# Patient Record
Sex: Female | Born: 1945 | Race: White | Hispanic: No | Marital: Married | Smoking: Former smoker
Health system: Southern US, Community
[De-identification: ages and names within clinical notes are randomized; demographics above are authoritative.]

## PROBLEM LIST (undated history)

## (undated) DIAGNOSIS — Z8709 Personal history of other diseases of the respiratory system: Secondary | ICD-10-CM

## (undated) DIAGNOSIS — F419 Anxiety disorder, unspecified: Secondary | ICD-10-CM

## (undated) DIAGNOSIS — E119 Type 2 diabetes mellitus without complications: Secondary | ICD-10-CM

## (undated) DIAGNOSIS — I8390 Asymptomatic varicose veins of unspecified lower extremity: Secondary | ICD-10-CM

## (undated) DIAGNOSIS — M2540 Effusion, unspecified joint: Secondary | ICD-10-CM

## (undated) DIAGNOSIS — K219 Gastro-esophageal reflux disease without esophagitis: Secondary | ICD-10-CM

## (undated) DIAGNOSIS — F329 Major depressive disorder, single episode, unspecified: Secondary | ICD-10-CM

## (undated) DIAGNOSIS — I82409 Acute embolism and thrombosis of unspecified deep veins of unspecified lower extremity: Secondary | ICD-10-CM

## (undated) DIAGNOSIS — J449 Chronic obstructive pulmonary disease, unspecified: Secondary | ICD-10-CM

## (undated) DIAGNOSIS — I739 Peripheral vascular disease, unspecified: Secondary | ICD-10-CM

## (undated) DIAGNOSIS — T148XXA Other injury of unspecified body region, initial encounter: Secondary | ICD-10-CM

## (undated) DIAGNOSIS — R011 Cardiac murmur, unspecified: Secondary | ICD-10-CM

## (undated) DIAGNOSIS — E785 Hyperlipidemia, unspecified: Secondary | ICD-10-CM

## (undated) DIAGNOSIS — D649 Anemia, unspecified: Secondary | ICD-10-CM

## (undated) DIAGNOSIS — R51 Headache: Secondary | ICD-10-CM

## (undated) DIAGNOSIS — R519 Headache, unspecified: Secondary | ICD-10-CM

## (undated) DIAGNOSIS — F32A Depression, unspecified: Secondary | ICD-10-CM

## (undated) DIAGNOSIS — IMO0001 Reserved for inherently not codable concepts without codable children: Secondary | ICD-10-CM

## (undated) DIAGNOSIS — N281 Cyst of kidney, acquired: Secondary | ICD-10-CM

## (undated) DIAGNOSIS — I2699 Other pulmonary embolism without acute cor pulmonale: Secondary | ICD-10-CM

## (undated) DIAGNOSIS — M1990 Unspecified osteoarthritis, unspecified site: Secondary | ICD-10-CM

## (undated) DIAGNOSIS — N393 Stress incontinence (female) (male): Secondary | ICD-10-CM

## (undated) DIAGNOSIS — Z8701 Personal history of pneumonia (recurrent): Secondary | ICD-10-CM

## (undated) HISTORY — PX: ESOPHAGOGASTRODUODENOSCOPY: SHX1529

## (undated) HISTORY — DX: Asymptomatic varicose veins of unspecified lower extremity: I83.90

## (undated) HISTORY — PX: TUBAL LIGATION: SHX77

## (undated) HISTORY — PX: COLONOSCOPY: SHX174

## (undated) HISTORY — DX: Acute embolism and thrombosis of unspecified deep veins of unspecified lower extremity: I82.409

## (undated) HISTORY — DX: Hyperlipidemia, unspecified: E78.5

## (undated) HISTORY — PX: CARPAL TUNNEL RELEASE: SHX101

## (undated) HISTORY — DX: Other pulmonary embolism without acute cor pulmonale: I26.99

## (undated) HISTORY — DX: Type 2 diabetes mellitus without complications: E11.9

## (undated) SURGERY — VIDEO BRONCHOSCOPY WITHOUT FLUORO
Anesthesia: General

---

## 1971-02-06 HISTORY — PX: APPENDECTOMY: SHX54

## 1992-02-06 HISTORY — PX: ABDOMINAL HYSTERECTOMY: SHX81

## 1992-02-06 HISTORY — PX: VESICOVAGINAL FISTULA CLOSURE W/ TAH: SUR271

## 1992-02-06 HISTORY — PX: OTHER SURGICAL HISTORY: SHX169

## 2006-12-27 ENCOUNTER — Ambulatory Visit: Payer: Self-pay | Admitting: Vascular Surgery

## 2010-06-20 NOTE — Consult Note (Signed)
NEW PATIENT CONSULTATION   Mason, Yvonne  DOB:  1945/12/20                                       12/27/2006  FAOZH#:[id number]   Patient presents today for continued discussion of her lower extremity  arterial insufficiency.  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  I  subsequently saw her again in February, 2002 with similar difficulty, at  which time she was found to have an ankle-arm index of 0.7 bilaterally,  associated with iliac occlusive disease.   She is here today for further discussion.  She had recently undergone  lower extremity arterial studies and also CT angiogram of her abdomen  and lower extremities.  This, again, revealed iliac occlusive disease,  and CT apparently shows bilateral common iliac artery occlusions.  She  has been stable from an overall medical standpoint.  She does have non-  insulin-dependent diabetes and elevated cholesterol.  She does have a  history of phlebitis and pulmonary embolus in the remote past.  She does  not have any cardiac history aside from a heart murmur, which is stable.  She does have premature atherosclerotic disease in her mother and  sister.   SOCIAL HISTORY:  She is married with five children.  She is retired.  She does smoke one pack of cigarettes per day and does not drink alcohol  on a regular basis.   Review of systems is multiply positive for shortness of breath with  exertion, heart murmur, bronchitis, peptic ulcer disease, diarrhea, and  constipation, urinary frequency and burning with urination.  She does  have headaches, arthritic joint pain, depression, and nervousness.   MEDICATION ALLERGIES:  CIPRO, ASPIRIN.   MEDICATIONS:  Propranolol, Crestor, Prevacid, glipizide, and Actos.   PHYSICAL EXAMINATION:  A well-developed and well-nourished white female  appearing her stated age of 49.  Blood pressure is 115/70, pulse 80,  respirations 18.  Her O2 saturations are 99% on room air.  Her carotid  arteries are without bruits bilaterally.  She is neurologically intact.  Her radial pulses are 2+.  She has 1+ femoral pulses bilaterally.  She  does have a 1+ right posterior tibial pulse, and I did not palpate pedal  pulses on the left.   I reviewed her studies with the patient.  I explained that she does  continue to have very stable lower extremity arterial insufficiency  related to her iliac occlusive disease.  I discussed options with her.  She reports that she has mild limitation only by the calf and leg  claudication and is comfortable to continue to observe this only.  She  does have a separate component of pain in her lower mid back that  extends down into her right leg, which she says occurs intermittently  and can be at rest or exercise.  I think that this would be very  unlikely to be related to arterial insufficiency.  She has had somewhat  of a workup for degenerative disk disease, and apparently this has been  negative.  I explained that I did not feel that any treatment for her  iliac occlusions would give any benefit on this component of her  discomfort.  She is pleased with this discussion and will see [place] on an  as-needed basis.   Larina Earthly,  M.D.  Electronically Signed   TFE/MEDQ  D:  12/27/2006  T:  12/30/2006  Job:  [id number]   cc:   Dr. Abner Greenspan

## 2014-08-16 ENCOUNTER — Ambulatory Visit (INDEPENDENT_AMBULATORY_CARE_PROVIDER_SITE_OTHER): Payer: Medicare Other | Admitting: Internal Medicine

## 2014-08-16 ENCOUNTER — Encounter: Payer: Self-pay | Admitting: Internal Medicine

## 2014-08-16 ENCOUNTER — Encounter (INDEPENDENT_AMBULATORY_CARE_PROVIDER_SITE_OTHER): Payer: Self-pay

## 2014-08-16 VITALS — BP 124/80 | HR 58 | Ht 63.0 in | Wt 137.8 lb

## 2014-08-16 DIAGNOSIS — R05 Cough: Secondary | ICD-10-CM | POA: Insufficient documentation

## 2014-08-16 DIAGNOSIS — F17210 Nicotine dependence, cigarettes, uncomplicated: Secondary | ICD-10-CM

## 2014-08-16 DIAGNOSIS — R590 Localized enlarged lymph nodes: Secondary | ICD-10-CM | POA: Insufficient documentation

## 2014-08-16 DIAGNOSIS — R0600 Dyspnea, unspecified: Secondary | ICD-10-CM | POA: Diagnosis not present

## 2014-08-16 DIAGNOSIS — I10 Essential (primary) hypertension: Secondary | ICD-10-CM

## 2014-08-16 DIAGNOSIS — R599 Enlarged lymph nodes, unspecified: Secondary | ICD-10-CM

## 2014-08-16 DIAGNOSIS — R059 Cough, unspecified: Secondary | ICD-10-CM

## 2014-08-16 MED ORDER — METHYLPREDNISOLONE ACETATE 80 MG/ML IJ SUSP
120.0000 mg | Freq: Once | INTRAMUSCULAR | Status: AC
  Administered 2014-08-16: 120 mg via INTRAMUSCULAR

## 2014-08-16 MED ORDER — FAMOTIDINE 20 MG PO TABS: ORAL_TABLET | ORAL | Status: AC

## 2014-08-16 MED ORDER — TIOTROPIUM BROMIDE-OLODATEROL 2.5-2.5 MCG/ACT IN AERS: 2.0000 | INHALATION_SPRAY | Freq: Every day | RESPIRATORY_TRACT | Status: AC

## 2014-08-16 MED ORDER — METOPROLOL TARTRATE 50 MG PO TABS: 50.0000 mg | ORAL_TABLET | Freq: Two times a day (BID) | ORAL | Status: AC

## 2014-08-16 MED ORDER — ALPRAZOLAM 0.5 MG PO TABS: ORAL_TABLET | ORAL | Status: DC

## 2014-08-16 MED ORDER — PANTOPRAZOLE SODIUM 40 MG PO TBEC: 40.0000 mg | DELAYED_RELEASE_TABLET | Freq: Every day | ORAL | Status: DC

## 2014-08-16 MED ORDER — TRAMADOL HCL 50 MG PO TABS: ORAL_TABLET | ORAL | Status: AC

## 2014-08-16 NOTE — Progress Notes (Signed)
   Subjective:    Patient ID: Yvonne Mason, female    DOB: April 30, 1945,  MRN: [id number]  HPI  1 yowf active smoker with cough x around 2012 much worse since 05/2014 > cxr  New R hilar density > CT c/w mediastinal nodes so referred to pulmonary clinic 08/16/14 by Dr Owens Shark in [facility]     08/16/2014 1st [facility] office visit/ [facility]   Chief Complaint  Patient presents with  . Pulmonary Consult    Referred by Dr. Rosario Adie for eval abnormal ct chest. Pt states she has had cough for years but it has been worse for the past 2 months. Cough is prod with yellow sputum- mainly in the am's. She states she gets SOB walking approx 30 ft.   on high dose inderal chronically for migraines and spiriva dpi which seems to make her cough more / no hemoptysis/ not much better on saba  No obvious day to day or daytime variability or assoc  cp or chest tightness, subjective wheeze or overt sinus   symptoms. No unusual exp hx or h/o childhood pna/ asthma or knowledge of premature birth.  Sleeping ok without nocturnal  or early am exacerbation  of respiratory  c/o's or need for noct saba. Also denies any obvious fluctuation of symptoms with weather or environmental changes or other aggravating or alleviating factors except as outlined above   Current Medications, Allergies, Complete Past Medical History, Past Surgical History, Family History, and Social History were reviewed in [employer] record.          Review of Systems  Constitutional: Negative for fever, chills and unexpected weight change.  HENT: Positive for congestion, dental problem, sore throat and trouble swallowing. Negative for ear pain, nosebleeds, postnasal drip, rhinorrhea, sinus pressure, sneezing and voice change.   Eyes: Negative for visual disturbance.  Respiratory: Positive for cough and shortness of breath. Negative for choking.   Cardiovascular: Positive for leg swelling. Negative for chest pain.    Gastrointestinal: Positive for abdominal pain. Negative for vomiting and diarrhea.  Genitourinary: Negative for difficulty urinating.       Acid heartburn Indigestion  Musculoskeletal: Positive for arthralgias.  Skin: Negative for rash.  Neurological: Negative for tremors, syncope and headaches.  Hematological: Does not bruise/bleed easily.       Objective:   Physical Exam  Amb wf nad  Wt Readings from Last 3 Encounters:  08/16/14 137 lb 12.8 oz (62.506 kg)    Vital signs reviewed   HEENT: top denture/ partial lower/ turbinates, and orophanx. Nl external ear canals without cough reflex   NECK :  without JVD/Nodes/TM/ nl carotid upstrokes bilaterally   LUNGS: no acc muscle use, clear to A and P bilaterally without cough on insp or exp maneuvers   CV:  RRR  no s3 or murmur or increase in P2, no edema   ABD:  soft and nontender with nl excursion in the supine position. 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

## 2014-08-16 NOTE — Patient Instructions (Signed)
The key to effective treatment for your cough is eliminating the non-stop cycle of cough you're stuck in long enough to let your airway heal completely and then see if there is anything still making you cough once you stop the cough suppression, but this should take no more than 5 days to figure out  First take delsym two tsp every 12 hours and supplement if needed with  tramadol 50 mg up to 2 every 4 hours to suppress the urge to cough at all or even clear your throat. Swallowing water or using ice chips/non mint and menthol containing candies (such as lifesavers or sugarless jolly ranchers) are also effective.  You should rest your voice and avoid activities that you know make you cough.  Once you have eliminated the cough for 3 straight days try reducing the tramadol first,  then the delsym as tolerated.    Depomedrol  120 mg   Protonix (pantoprazole) Take 30-60 min before first meal of the day and Pepcid 20 mg one bedtime plus chlorpheniramine 4 mg x 2 at bedtime (both available over the counter)  until cough is completely gone for at least a week without the need for cough suppression  GERD (REFLUX)  is an extremely common cause of respiratory symptoms, many times with no significant heartburn at all.    It can be treated with medication, but also with lifestyle changes including avoidance of late meals, excessive alcohol, smoking cessation, and avoid fatty foods, chocolate, peppermint, colas, red wine, and acidic juices such as orange juice.  NO MINT OR MENTHOL PRODUCTS SO NO COUGH DROPS  USE HARD CANDY INSTEAD (jolley ranchers or Stover's or Lifesavers (all available in sugarless versions) NO OIL BASED VITAMINS - use powdered substitutes.  Stop stiolto  and start stiolto 2 each am  Stop inderal and start metaprolol 50 mg twice daily   Please see patient coordinator before you leave today  to schedule PET scan and I will call you to let you know what the next step is

## 2014-08-17 ENCOUNTER — Telehealth: Payer: Self-pay | Admitting: Internal Medicine

## 2014-08-17 DIAGNOSIS — I10 Essential (primary) hypertension: Secondary | ICD-10-CM | POA: Insufficient documentation

## 2014-08-17 DIAGNOSIS — R059 Cough, unspecified: Secondary | ICD-10-CM

## 2014-08-17 DIAGNOSIS — R05 Cough: Secondary | ICD-10-CM

## 2014-08-17 DIAGNOSIS — F17210 Nicotine dependence, cigarettes, uncomplicated: Secondary | ICD-10-CM | POA: Insufficient documentation

## 2014-08-17 NOTE — Assessment & Plan Note (Addendum)
-  See CT chest 08/10/14 corresponding to new R hilar density on plain cxr vs 08/06/13  So this is definitely not an incidentaloma with ddx including lung ca and lymphoma vs other met dz   Discussed in detail all the  indications, usual  risks and alternatives  relative to the benefits with patient who agrees to proceed with PET first then decide on bes approach to bx but first need to work on breathing/coughing

## 2014-08-17 NOTE — Assessment & Plan Note (Signed)
>   3 min discussion I reviewed the Fletcher curve with the patient that basically indicates  if you quit smoking when your best day FEV1 is still well preserved (as is probably the case here)  it is highly unlikely you will progress to severe disease and informed the patient there was no medication on the market that has proven to alter the curve/ its downward trajectory  or the likelihood of progression of their disease.  Therefore stopping smoking and maintaining abstinence is the most important aspect of care, not choice of inhalers or for that matter, doctors.

## 2014-08-17 NOTE — Assessment & Plan Note (Signed)
Chronic / severe/ difficult to control. DDX of  difficult airways management all start with A and  include Adherence, Ace Inhibitors, Acid Reflux, Active Sinus Disease, Alpha 1 Antitripsin deficiency, Anxiety masquerading as Airways dz,  ABPA,  allergy(esp in young), Aspiration (esp in elderly), Adverse effects of meds,  Active smokers, A bunch of PE's (a small clot burden can't cause this syndrome unless there is already severe underlying pulm or vascular dz with poor reserve) plus two Bs  = Bronchiectasis and Beta blocker use..and one C= CHF  Adherence is always the initial "prime suspect" and is a multilayered concern that requires a "trust but verify" approach in every patient - starting with knowing how to use medications, especially inhalers, correctly, keeping up with refills and understanding the fundamental difference between maintenance and prns vs those medications only taken for a very short course and then stopped and not refilled.  - The proper method of use, as well as anticipated side effects, of a metered-dose inhaler are discussed and demonstrated to the patient. Improved effectiveness after extensive coaching during this visit to a level of approximately  75% > try respimat stiolto 2 q am  Active smoking > see sep a/p  ? Acid (or non-acid) GERD > always difficult to exclude as up to 75% of pts in some series report no assoc GI/ Heartburn symptoms> rec max (24h)  acid suppression and diet restrictions/ reviewed and instructions given in writing.   ? Adverse effects of dpi > d/c spiriva dpi and replace with stiolto respimat    I had an extended discussion with the patient reviewing all relevant studies completed to date x 24 m   Each maintenance medication was reviewed in detail including most importantly the difference between maintenance and prns and under what circumstances the prns are to be triggered using an action plan format that is not reflected in the computer generated  alphabetically organized AVS.    Please see instructions for details which were reviewed in writing and the patient given a copy highlighting the part that I personally wrote and discussed at today's ov.

## 2014-08-17 NOTE — Telephone Encounter (Signed)
lmtcb x1 

## 2014-08-17 NOTE — Assessment & Plan Note (Signed)
-   spirometry 08/16/14 non physiologic - 08/17/2014  Walked RA  2 laps @ 185 ft each stopped due to  Sob and leg pain, nl pace no desat   Not really clear how much copd she has at this point / will focus on eliminating cough.

## 2014-08-17 NOTE — Assessment & Plan Note (Signed)
Strongly prefer in this setting: Bystolic, the most beta -1  selective Beta blocker available in sample form, with bisoprolol the most selective generic choice  on the market.     However, since she's using inderal also for migraines and really doesn't appear to have much of an asthmatic component rec lopressor 50 mg bid trial as it is intermediate between inderal and bisoprolol

## 2014-08-18 MED ORDER — PANTOPRAZOLE SODIUM 40 MG PO TBEC: 40.0000 mg | DELAYED_RELEASE_TABLET | Freq: Every day | ORAL | Status: AC

## 2014-08-18 NOTE — Telephone Encounter (Signed)
Daughter informed new rx sent to pharmacy. Nothing further needed.

## 2014-08-24 ENCOUNTER — Encounter (HOSPITAL_COMMUNITY)
Admission: RE | Admit: 2014-08-24 | Discharge: 2014-08-24 | Disposition: A | Discharge status: Home / Self Care | Payer: Medicare Other | Source: Ambulatory Visit | Attending: Internal Medicine | Admitting: Internal Medicine

## 2014-08-24 DIAGNOSIS — R590 Localized enlarged lymph nodes: Secondary | ICD-10-CM

## 2014-08-24 DIAGNOSIS — R599 Enlarged lymph nodes, unspecified: Secondary | ICD-10-CM | POA: Diagnosis not present

## 2014-08-24 LAB — GLUCOSE, CAPILLARY: Glucose-Capillary: 130 mg/dL — ABNORMAL HIGH (ref 65–99)

## 2014-08-24 MED ORDER — FLUDEOXYGLUCOSE F - 18 (FDG) INJECTION
6.9000 | Freq: Once | INTRAVENOUS | Status: AC | PRN
  Administered 2014-08-24: 6.9 via INTRAVENOUS

## 2014-08-30 ENCOUNTER — Telehealth: Payer: Self-pay | Admitting: Internal Medicine

## 2014-08-30 DIAGNOSIS — R590 Localized enlarged lymph nodes: Secondary | ICD-10-CM

## 2014-08-30 NOTE — Telephone Encounter (Signed)
Dr Lake Bells reviewed the case and agreed to do EBUS and turned it over to [facility] to schedule but she was gone 7/22 and today so that's probably why she hasn't heard from them

## 2014-08-30 NOTE — Telephone Encounter (Signed)
Patient notified that she will get a call from [facility] once she has set up the EBUS.  Patient wanted to know if she has to do it at [facility] or can she have it done at [facility]?  She would prefer to have it done at [facility].  Patient is aware that Caryl Pina is not in the office today and will contact her once she gets back.  (Per Dr. Melvyn Novas, Caryl Pina and [facility] are working on this, I do not see any notes in the chart regarding EBUS)  To Caryl Pina for follow up

## 2014-08-30 NOTE — Telephone Encounter (Signed)
I called spoke with pt. She reports MW called her last week and reports he was going to refer her to [facility]. Nothing in epic. Please advise thanks

## 2014-08-31 NOTE — Telephone Encounter (Signed)
Order placed.  Please let me know if you need anything further.  Thanks!

## 2014-08-31 NOTE — Telephone Encounter (Signed)
Libby generally schedules our EBUS's.  I do not see anything scheduled for this.    Libby, please advise if you have anything on this.  Thanks!

## 2014-08-31 NOTE — Telephone Encounter (Signed)
Nobody out in an order in your absence so just put me in an order thanks Joellen Jersey

## 2014-09-01 ENCOUNTER — Telehealth: Payer: Self-pay | Admitting: Internal Medicine

## 2014-09-02 NOTE — Telephone Encounter (Signed)
Spoke with patient states that she was notified of her upcoming procedure EBUS'@[facility]'$  09/06/14'@7'$ :30am  Patient states that she was supposed to get a call back with further information but has not heard from [facility].  Will send to [facility] to contact patient.

## 2014-09-02 NOTE — Telephone Encounter (Signed)
Pt notified someon from [facility] will call her with pre-op ins before her appt on 09/06/14  Joellen Jersey

## 2014-09-06 ENCOUNTER — Ambulatory Visit (HOSPITAL_COMMUNITY): Payer: Medicare Other

## 2014-09-06 ENCOUNTER — Ambulatory Visit (HOSPITAL_COMMUNITY): Admission: RE | Admit: 2014-09-06 | Payer: Medicare Other | Source: Ambulatory Visit | Admitting: Pulmonary Disease

## 2014-09-06 ENCOUNTER — Encounter (HOSPITAL_COMMUNITY): Admission: RE | Payer: Self-pay | Source: Ambulatory Visit

## 2014-09-06 SURGERY — ENDOBRONCHIAL ULTRASOUND (EBUS)
Anesthesia: General | Laterality: Bilateral

## 2014-09-07 ENCOUNTER — Telehealth: Payer: Self-pay | Admitting: Pulmonary Disease

## 2014-09-07 ENCOUNTER — Other Ambulatory Visit: Payer: Self-pay | Admitting: Pulmonary Disease

## 2014-09-07 DIAGNOSIS — R590 Localized enlarged lymph nodes: Secondary | ICD-10-CM

## 2014-09-07 NOTE — Progress Notes (Signed)
Spoke with Yvonne Mason at Dr. Anastasia Pall office and requested that orders be placed prior to patient's PAT appointment.

## 2014-09-07 NOTE — Telephone Encounter (Signed)
Called spoke w/ Ria Comment from [facility] short stay. She reports pt is going to have bronch done and they are wanting orders prior to pt pre admit tomorrow AM at 10.  Please advise thanks

## 2014-09-07 NOTE — Telephone Encounter (Signed)
Hi, I tried to put in what orders I can.  However we typically use an orderset that is accessible once the patient has the appointment scheduled.  If they can create the surgery encounter I can use the orderset to put in all the usual orders.  You might ask [facility] for more information

## 2014-09-08 ENCOUNTER — Encounter (HOSPITAL_COMMUNITY): Payer: Self-pay

## 2014-09-08 ENCOUNTER — Encounter (HOSPITAL_COMMUNITY)
Admission: RE | Admit: 2014-09-08 | Discharge: 2014-09-08 | Disposition: A | Discharge status: Home / Self Care | Payer: Medicare Other | Source: Ambulatory Visit | Attending: Pulmonary Disease | Admitting: Pulmonary Disease

## 2014-09-08 ENCOUNTER — Other Ambulatory Visit: Payer: Self-pay

## 2014-09-08 DIAGNOSIS — J449 Chronic obstructive pulmonary disease, unspecified: Secondary | ICD-10-CM | POA: Diagnosis not present

## 2014-09-08 DIAGNOSIS — R590 Localized enlarged lymph nodes: Secondary | ICD-10-CM | POA: Diagnosis present

## 2014-09-08 DIAGNOSIS — E785 Hyperlipidemia, unspecified: Secondary | ICD-10-CM | POA: Diagnosis not present

## 2014-09-08 DIAGNOSIS — E119 Type 2 diabetes mellitus without complications: Secondary | ICD-10-CM | POA: Diagnosis not present

## 2014-09-08 DIAGNOSIS — F17210 Nicotine dependence, cigarettes, uncomplicated: Secondary | ICD-10-CM | POA: Diagnosis not present

## 2014-09-08 DIAGNOSIS — C383 Malignant neoplasm of mediastinum, part unspecified: Secondary | ICD-10-CM | POA: Diagnosis not present

## 2014-09-08 HISTORY — DX: Anxiety disorder, unspecified: F41.9

## 2014-09-08 HISTORY — DX: Unspecified osteoarthritis, unspecified site: M19.90

## 2014-09-08 HISTORY — DX: Personal history of pneumonia (recurrent): Z87.01

## 2014-09-08 HISTORY — DX: Major depressive disorder, single episode, unspecified: F32.9

## 2014-09-08 HISTORY — DX: Headache: R51

## 2014-09-08 HISTORY — DX: Cardiac murmur, unspecified: R01.1

## 2014-09-08 HISTORY — DX: Cyst of kidney, acquired: N28.1

## 2014-09-08 HISTORY — DX: Stress incontinence (female) (male): N39.3

## 2014-09-08 HISTORY — DX: Anemia, unspecified: D64.9

## 2014-09-08 HISTORY — DX: Effusion, unspecified joint: M25.40

## 2014-09-08 HISTORY — DX: Peripheral vascular disease, unspecified: I73.9

## 2014-09-08 HISTORY — DX: Depression, unspecified: F32.A

## 2014-09-08 HISTORY — DX: Chronic obstructive pulmonary disease, unspecified: J44.9

## 2014-09-08 HISTORY — DX: Other injury of unspecified body region, initial encounter: T14.8XXA

## 2014-09-08 HISTORY — DX: Gastro-esophageal reflux disease without esophagitis: K21.9

## 2014-09-08 HISTORY — DX: Personal history of other diseases of the respiratory system: Z87.09

## 2014-09-08 HISTORY — DX: Reserved for inherently not codable concepts without codable children: IMO0001

## 2014-09-08 HISTORY — DX: Headache, unspecified: R51.9

## 2014-09-08 LAB — PROTIME-INR
INR: 0.99 (ref 0.00–1.49)
Prothrombin Time: 13.3 seconds (ref 11.6–15.2)

## 2014-09-08 LAB — GLUCOSE, CAPILLARY: Glucose-Capillary: 156 mg/dL — ABNORMAL HIGH (ref 65–99)

## 2014-09-08 NOTE — Telephone Encounter (Signed)
Spoke with Yvonne Mason at [facility] short stay in regards to BQ response about the orders. Informed her what BQ said and she states that she would try to find the orders and if she has any further questions she would give [place] a call back. Nothing further needed.

## 2014-09-08 NOTE — Progress Notes (Signed)
PCP- Dr. Laqueta Due at [facility]  Cardiologist - pt. Denies having a cardiologist EKG- 09/08/14 - Epic Echo- denies CXR- requested from [facility] Stress Test- requested from [facility] Cardiac Cath - denies  Pt. States that she has had multiple blood clots and takes Coumadin.  Pt. Stopped her Coumadin on Sunday, August 31st per PCP.  Patient states she will call Dr. Anastasia Pall office today to see if she needs to take a different blood thinner due to clots.

## 2014-09-08 NOTE — Pre-Procedure Instructions (Signed)
    Lexia Vandevender  09/08/2014      [id number] [place] [facility] [id number]: [phone]: [phone]   Your procedure is scheduled on Friday, August 5th, 2016.  [facility]  Call this number if you have problems the morning of surgery:  [phone]   Remember:  Do not eat food or drink liquids after midnight.   Take these medicines the morning of surgery with A SIP OF WATER: Acetaminophen (Tylenol) if needed, Albuterol inhaler (please bring with you), Bupropion (Wellbutrin), Famotidine (Pepcid), fluticasone (Flonase),  Metoprolol (Lopressor), Pantoprazole (Protonix), tiotropium Bromide-Olodaterol, Tramadol (Ultram) if needed.    Stop taking: Warfarin (Coumadin), NSAIDS, Aspirin, Aleve, Naproxen, Ibuprofen, BC's, Goody's, Fish Oil, all herbal medications, and all vitamins.    Do not wear jewelry, make-up or nail polish.  Do not wear lotions, powders, or perfumes.  You may wear deodorant.  Do not shave 48 hours prior to surgery.    Do not bring valuables to the hospital.  [facility] any belongings or [facility]ables.  Contacts, dentures or bridgework may not be worn into surgery.  Leave your suitcase in the car.  After surgery it may be brought to your room.  For patients admitted to the hospital, discharge time will be determined by your treatment team.  Patients discharged the day of surgery will not be allowed to drive home.   Special instructions:  See attached.   Please read over the following fact sheets that you were given. Pain Booklet, Coughing and Deep Breathing and Surgical Site Infection Prevention

## 2014-09-09 ENCOUNTER — Telehealth: Payer: Self-pay | Admitting: Pulmonary Disease

## 2014-09-09 ENCOUNTER — Encounter (HOSPITAL_COMMUNITY): Payer: Self-pay

## 2014-09-09 LAB — HEMOGLOBIN A1C
HEMOGLOBIN A1C: 6 % — AB (ref 4.8–5.6)
Mean Plasma Glucose: 126 mg/dL

## 2014-09-09 NOTE — Telephone Encounter (Signed)
Will restart warfarin after the procedure

## 2014-09-09 NOTE — Progress Notes (Signed)
Anesthesia Chart Review: Patient is a 69 year old female scheduled for EBUS on 09/10/14 by Dr. Lake Bells.  History includes mediastinal adenopathy, smoking, HLD, DVT X 3 previously LLE, last "right foot" 2015), post-operative PE '94, DM2, murmur (not specified), PVD/varicose vein LLE, COPD, SOB, anxiety, depression, renal cyst (right), GERD, migraines, anemia, hysterectomy '94, appendectomy. PCP is Dr. Laqueta Due in [place]. She is not routinely followed by cardiology, but reports a prior stress test at [facility] in [place] > 3 years ago which has been managed by three different groups over the past 10 or so years (most recently [facility] since 2016 and prior to that [facility]). We were referred to [facility] to request records, but were told none found.      Meds include albuterol, Wellbutrin XL, Pepcid, Flonase, glipizide, metoprolol, Protonix, Actos, Stiolto Respimato, tramadol, warfarin--on hold for 5 days before procedure as she has been told in the past.    09/08/14 EKG: NSR, R BBB. Currently, no comparison EKG is available. She denied chest pain and SOB at rest. Activity is more limited by leg pain (arthritis) and to an extent exertional dyspnea which she says has been stable for at least two years (ie DOE when walking up an incline). She was able to walk from the hospital entrance to PAT without stopping or significant SOB. She has chronic LLE edema related to varicose veins. Denied known MI/CAD, CHF history. She reports some doctors have heard a slight murmur, others haven't. Reportedly, she has never had any doctor recommend that she undergo an echocardiogram to further evaluation.    09/04/14 CXR ([facility]): There is a new increased density in the right suprahilar region measuring 2.3 X 3.5 cm. No infiltrate edema mass or effusion otherwise. Heart and bony structures are unremarkable. Impression: Right suprahilar density. Adenopathy versus mass. Recommend chest CT with IV  contrast for further evaluation.  08/13/14 Chest CT W/ [facility]): Two adjacent soft tissue masses within the mediastinum favored to represent bulky lymphadenopathy, potentially in the setting of metastatic disease or lymphoma. Non-specific patchy sclerosis within the lateral aspect of the right ninth rib, this may be postraumatic in etiology or potentially represent a malignant process. Persistent indeterminate and incompletely  characterized low-attenuation lesions within the superior pole of the right kidney. Recommend dedicated evaluation with pre and post contrast-enhanced MRI or CT for definitive characterization.    For unclear reasons only PT/INR and A1C results noted from PAT. She will need a CBC and BMET on arrival. I'll attempt to get comparison labs from her PCP. She reports that she has had protein in her urine before and that was recently told she was mildly anemic by Dr. Laqueta Due.   She denied chest pain, new SOB. Last DVT was ~ one year ago. She will need additional labs tomorrow. If no new symptoms, labs are acceptable, and no worrisome murmur heard on exam then I would anticipate that she can proceed with this procedure. Further evaluation by her assigned anesthesiologist on arrival tomorrow.  George [facility]Anesthesiology Phone [phone] 09/09/2014 11:31 AM

## 2014-09-09 NOTE — Telephone Encounter (Signed)
Called and made pt aware of below. She verbalized understanding and had no questions

## 2014-09-09 NOTE — Progress Notes (Signed)
[facility] to follow-up on requested records.  Was informed that records would need to be requested from [facility] instead of [facility].  Spoke with Ebony Hail at [facility], medical records who stated that they did not have any records for patient.

## 2014-09-09 NOTE — Telephone Encounter (Signed)
[phone] calling back

## 2014-09-09 NOTE — Telephone Encounter (Signed)
Spoke with pt, had her pre-op yesterday for her bronch scheduled with BQ tomorrow.  Pt has been off coumadin since Sunday, and nurse working the pre-op was concerned that she's been off since Sunday.  Nurse at pre-op had suggested that pt let BQ know about her coumadin dose, and see if he recommends an ASA for pt.  I confirmed with pt that ASA is on her allergy list.  Pt states that ASA makes her nauseous.    BQ please advise on any recs.  Thanks!

## 2014-09-09 NOTE — Telephone Encounter (Signed)
lmtcb for pt.  

## 2014-09-10 ENCOUNTER — Ambulatory Visit (HOSPITAL_COMMUNITY): Payer: Medicare Other | Admitting: Vascular Surgery

## 2014-09-10 ENCOUNTER — Telehealth: Payer: Self-pay | Admitting: Pulmonary Disease

## 2014-09-10 ENCOUNTER — Ambulatory Visit (HOSPITAL_COMMUNITY): Payer: Medicare Other | Admitting: Certified Registered Nurse Anesthetist

## 2014-09-10 ENCOUNTER — Encounter (HOSPITAL_COMMUNITY)
Admission: RE | Disposition: A | Discharge status: Home / Self Care | Payer: Self-pay | Source: Ambulatory Visit | Attending: Pulmonary Disease

## 2014-09-10 ENCOUNTER — Encounter (HOSPITAL_COMMUNITY): Payer: Self-pay | Admitting: *Deleted

## 2014-09-10 ENCOUNTER — Other Ambulatory Visit: Payer: Self-pay | Admitting: Pulmonary Disease

## 2014-09-10 ENCOUNTER — Ambulatory Visit (HOSPITAL_COMMUNITY)
Admission: RE | Admit: 2014-09-10 | Discharge: 2014-09-10 | Disposition: A | Discharge status: Home / Self Care | Payer: Medicare Other | Source: Ambulatory Visit | Attending: Pulmonary Disease | Admitting: Pulmonary Disease

## 2014-09-10 DIAGNOSIS — E119 Type 2 diabetes mellitus without complications: Secondary | ICD-10-CM | POA: Diagnosis not present

## 2014-09-10 DIAGNOSIS — R590 Localized enlarged lymph nodes: Secondary | ICD-10-CM

## 2014-09-10 DIAGNOSIS — J449 Chronic obstructive pulmonary disease, unspecified: Secondary | ICD-10-CM | POA: Insufficient documentation

## 2014-09-10 DIAGNOSIS — R599 Enlarged lymph nodes, unspecified: Secondary | ICD-10-CM

## 2014-09-10 DIAGNOSIS — C3401 Malignant neoplasm of right main bronchus: Secondary | ICD-10-CM

## 2014-09-10 DIAGNOSIS — F17210 Nicotine dependence, cigarettes, uncomplicated: Secondary | ICD-10-CM | POA: Insufficient documentation

## 2014-09-10 DIAGNOSIS — E785 Hyperlipidemia, unspecified: Secondary | ICD-10-CM | POA: Diagnosis not present

## 2014-09-10 DIAGNOSIS — C383 Malignant neoplasm of mediastinum, part unspecified: Secondary | ICD-10-CM | POA: Diagnosis not present

## 2014-09-10 DIAGNOSIS — Z720 Tobacco use: Secondary | ICD-10-CM

## 2014-09-10 HISTORY — PX: VIDEO BRONCHOSCOPY WITH ENDOBRONCHIAL ULTRASOUND: SHX6177

## 2014-09-10 LAB — BASIC METABOLIC PANEL
ANION GAP: 7 (ref 5–15)
BUN: 14 mg/dL (ref 6–20)
CALCIUM: 9.9 mg/dL (ref 8.9–10.3)
CHLORIDE: 107 mmol/L (ref 101–111)
CO2: 26 mmol/L (ref 22–32)
Creatinine, Ser: 0.83 mg/dL (ref 0.44–1.00)
GFR calc Af Amer: >60 mL/min (ref >60)
GFR calc non Af Amer: >60 mL/min (ref >60)
Glucose, Bld: 127 mg/dL — ABNORMAL HIGH (ref 65–99)
POTASSIUM: 4 mmol/L (ref 3.5–5.1)
Sodium: 140 mmol/L (ref 135–145)

## 2014-09-10 LAB — CBC
HEMATOCRIT: 34.2 % — AB (ref 36.0–46.0)
HEMOGLOBIN: 11.2 g/dL — AB (ref 12.0–15.0)
MCH: 29.5 pg (ref 26.0–34.0)
MCHC: 32.7 g/dL (ref 30.0–36.0)
MCV: 90 fL (ref 78.0–100.0)
PLATELETS: 283 10*3/uL (ref 150–400)
RBC: 3.8 MIL/uL — ABNORMAL LOW (ref 3.87–5.11)
RDW: 15 % (ref 11.5–15.5)
WBC: 6.8 10*3/uL (ref 4.0–10.5)

## 2014-09-10 LAB — GLUCOSE, CAPILLARY
GLUCOSE-CAPILLARY: 128 mg/dL — AB (ref 65–99)
Glucose-Capillary: 93 mg/dL (ref 65–99)

## 2014-09-10 SURGERY — BRONCHOSCOPY, WITH EBUS
Anesthesia: General | Site: Chest

## 2014-09-10 MED ORDER — GLYCOPYRROLATE 0.2 MG/ML IJ SOLN
INTRAMUSCULAR | Status: DC | PRN
  Administered 2014-09-10: .4 mg via INTRAVENOUS

## 2014-09-10 MED ORDER — ROCURONIUM BROMIDE 50 MG/5ML IV SOLN
INTRAVENOUS | Status: AC
  Filled 2014-09-10: qty 1

## 2014-09-10 MED ORDER — STERILE WATER FOR INJECTION IJ SOLN
INTRAMUSCULAR | Status: AC
  Filled 2014-09-10: qty 20

## 2014-09-10 MED ORDER — LIDOCAINE HCL (CARDIAC) 20 MG/ML IV SOLN
INTRAVENOUS | Status: DC | PRN
  Administered 2014-09-10: 60 mg via INTRAVENOUS

## 2014-09-10 MED ORDER — LACTATED RINGERS IV SOLN
INTRAVENOUS | Status: DC
  Administered 2014-09-10 (×2): via INTRAVENOUS

## 2014-09-10 MED ORDER — 0.9 % SODIUM CHLORIDE (POUR BTL) OPTIME
TOPICAL | Status: DC | PRN
  Administered 2014-09-10: 1000 mL

## 2014-09-10 MED ORDER — MIDAZOLAM HCL 5 MG/5ML IJ SOLN
INTRAMUSCULAR | Status: DC | PRN
  Administered 2014-09-10: 2 mg via INTRAVENOUS

## 2014-09-10 MED ORDER — LACTATED RINGERS IV SOLN: INTRAVENOUS | Status: DC

## 2014-09-10 MED ORDER — NEOSTIGMINE METHYLSULFATE 10 MG/10ML IV SOLN
INTRAVENOUS | Status: DC | PRN
  Administered 2014-09-10: 3 mg via INTRAVENOUS

## 2014-09-10 MED ORDER — VECURONIUM BROMIDE 10 MG IV SOLR
INTRAVENOUS | Status: AC
  Filled 2014-09-10: qty 20

## 2014-09-10 MED ORDER — EPHEDRINE SULFATE 50 MG/ML IJ SOLN
INTRAMUSCULAR | Status: DC | PRN
  Administered 2014-09-10: 5 mg via INTRAVENOUS

## 2014-09-10 MED ORDER — EPHEDRINE SULFATE 50 MG/ML IJ SOLN
INTRAMUSCULAR | Status: AC
  Filled 2014-09-10: qty 3

## 2014-09-10 MED ORDER — ONDANSETRON HCL 4 MG/2ML IJ SOLN
INTRAMUSCULAR | Status: DC | PRN
  Administered 2014-09-10: 4 mg via INTRAVENOUS

## 2014-09-10 MED ORDER — BUTAMBEN-TETRACAINE-BENZOCAINE 2-2-14 % EX AERO: 1.0000 | INHALATION_SPRAY | Freq: Once | CUTANEOUS | Status: DC

## 2014-09-10 MED ORDER — ROCURONIUM BROMIDE 100 MG/10ML IV SOLN
INTRAVENOUS | Status: DC | PRN
  Administered 2014-09-10: 30 mg via INTRAVENOUS

## 2014-09-10 MED ORDER — FENTANYL CITRATE (PF) 100 MCG/2ML IJ SOLN
INTRAMUSCULAR | Status: DC | PRN
  Administered 2014-09-10 (×3): 50 ug via INTRAVENOUS

## 2014-09-10 MED ORDER — NEOSTIGMINE METHYLSULFATE 10 MG/10ML IV SOLN
INTRAVENOUS | Status: AC
  Filled 2014-09-10: qty 2

## 2014-09-10 MED ORDER — MEPERIDINE HCL 25 MG/ML IJ SOLN: 6.2500 mg | INTRAMUSCULAR | Status: DC | PRN

## 2014-09-10 MED ORDER — SODIUM CHLORIDE 0.9 % IJ SOLN
INTRAMUSCULAR | Status: AC
  Filled 2014-09-10: qty 10

## 2014-09-10 MED ORDER — MIDAZOLAM HCL 2 MG/2ML IJ SOLN
INTRAMUSCULAR | Status: AC
  Filled 2014-09-10: qty 4

## 2014-09-10 MED ORDER — PHENYLEPHRINE 40 MCG/ML (10ML) SYRINGE FOR IV PUSH (FOR BLOOD PRESSURE SUPPORT)
PREFILLED_SYRINGE | INTRAVENOUS | Status: AC
  Filled 2014-09-10: qty 20

## 2014-09-10 MED ORDER — PHENYLEPHRINE HCL 0.25 % NA SOLN
1.0000 | Freq: Four times a day (QID) | NASAL | Status: DC | PRN
  Filled 2014-09-10: qty 15

## 2014-09-10 MED ORDER — ONDANSETRON HCL 4 MG/2ML IJ SOLN
INTRAMUSCULAR | Status: AC
  Filled 2014-09-10: qty 2

## 2014-09-10 MED ORDER — SUCCINYLCHOLINE CHLORIDE 20 MG/ML IJ SOLN
INTRAMUSCULAR | Status: AC
  Filled 2014-09-10: qty 1

## 2014-09-10 MED ORDER — FENTANYL CITRATE (PF) 250 MCG/5ML IJ SOLN
INTRAMUSCULAR | Status: AC
  Filled 2014-09-10: qty 5

## 2014-09-10 MED ORDER — PROPOFOL 10 MG/ML IV BOLUS
INTRAVENOUS | Status: DC | PRN
  Administered 2014-09-10: 120 mg via INTRAVENOUS

## 2014-09-10 MED ORDER — ESMOLOL HCL 10 MG/ML IV SOLN
INTRAVENOUS | Status: AC
  Filled 2014-09-10: qty 10

## 2014-09-10 MED ORDER — LIDOCAINE HCL (CARDIAC) 20 MG/ML IV SOLN
INTRAVENOUS | Status: AC
  Filled 2014-09-10: qty 5

## 2014-09-10 MED ORDER — LIDOCAINE HCL 2 % EX GEL: 1.0000 "application " | Freq: Once | CUTANEOUS | Status: DC

## 2014-09-10 MED ORDER — PROMETHAZINE HCL 25 MG/ML IJ SOLN: 6.2500 mg | INTRAMUSCULAR | Status: DC | PRN

## 2014-09-10 MED ORDER — FENTANYL CITRATE (PF) 100 MCG/2ML IJ SOLN: 25.0000 ug | INTRAMUSCULAR | Status: DC | PRN

## 2014-09-10 MED ORDER — PROPOFOL 10 MG/ML IV BOLUS
INTRAVENOUS | Status: AC
  Filled 2014-09-10: qty 20

## 2014-09-10 SURGICAL SUPPLY — 25 items
BRUSH CYTOL CELLEBRITY 1.5X140 (MISCELLANEOUS) IMPLANT
CANISTER SUCTION 2500CC (MISCELLANEOUS) ×3 IMPLANT
CONT SPEC 4OZ CLIKSEAL STRL BL (MISCELLANEOUS) ×3 IMPLANT
COVER TABLE BACK 60X90 (DRAPES) ×3 IMPLANT
FORCEPS BIOP RJ4 1.8 (CUTTING FORCEPS) IMPLANT
GAUZE SPONGE 4X4 12PLY STRL (GAUZE/BANDAGES/DRESSINGS) ×3 IMPLANT
GLOVE BIOGEL M STRL SZ7.5 (GLOVE) ×3 IMPLANT
GLOVE SURG SS PI 7.0 STRL IVOR (GLOVE) ×3 IMPLANT
GOWN STRL REUS W/ TWL LRG LVL3 (GOWN DISPOSABLE) ×1 IMPLANT
GOWN STRL REUS W/TWL LRG LVL3 (GOWN DISPOSABLE) ×2
KIT CLEAN ENDO COMPLIANCE (KITS) ×3 IMPLANT
KIT ROOM TURNOVER OR (KITS) ×3 IMPLANT
MARKER SKIN DUAL TIP RULER LAB (MISCELLANEOUS) ×3 IMPLANT
NEEDLE BIOPSY TRANSBRONCH 21G (NEEDLE) IMPLANT
NEEDLE SONO TIP II EBUS (NEEDLE) ×6 IMPLANT
NS IRRIG 1000ML POUR BTL (IV SOLUTION) ×3 IMPLANT
OIL SILICONE PENTAX (PARTS (SERVICE/REPAIRS)) ×3 IMPLANT
PAD ARMBOARD 7.5X6 YLW CONV (MISCELLANEOUS) ×6 IMPLANT
SYR 20CC LL (SYRINGE) ×3 IMPLANT
SYR 20ML ECCENTRIC (SYRINGE) ×6 IMPLANT
SYR 5ML LUER SLIP (SYRINGE) ×3 IMPLANT
TOWEL OR 17X24 6PK STRL BLUE (TOWEL DISPOSABLE) ×3 IMPLANT
TRAP SPECIMEN MUCOUS 40CC (MISCELLANEOUS) IMPLANT
TUBE CONNECTING 20'X1/4 (TUBING) ×2
TUBE CONNECTING 20X1/4 (TUBING) ×4 IMPLANT

## 2014-09-10 NOTE — Progress Notes (Signed)
Called Dr.Hollis for sign out

## 2014-09-10 NOTE — Op Note (Signed)
Video Bronchoscopy with Endobronchial Ultrasound Procedure Note  Date of Operation: 09/10/2014  Pre-op Diagnosis: Mediastinal lymphadenopathy  Post-op Diagnosis: Non-small cell cancer  Surgeon: Roselie Awkward MD  Assistants:   Anesthesia: General endotracheal anesthesia  Operation: Flexible video fiberoptic bronchoscopy with endobronchial ultrasound and biopsies.  Estimated Blood Loss: Minimal  Complications: None immediate  Indications and History: Yvonne Mason is a 69 y.o. female with mediastinal lymphadenopathy seen on CT chest and PET CT.  The risks, benefits, complications, treatment options and expected outcomes were discussed with the patient.  The possibilities of pneumothorax, pneumonia, reaction to medication, pulmonary aspiration, perforation of a viscus, bleeding, failure to diagnose a condition and creating a complication requiring transfusion or operation were discussed with the patient who freely signed the consent.    Description of Procedure: The patient was examined in the preoperative area and history and data from the preprocedure consultation were reviewed. It was deemed appropriate to proceed.  The patient was taken to the [facility], identified as Yvonne Mason and the procedure verified as Flexible Video Fiberoptic Bronchoscopy.  A Time Out was held and the above information confirmed. After being taken to the operating room general anesthesia was initiated and the patient  was orally intubated. The video fiberoptic bronchoscope was introduced via the endotracheal tube and a general inspection was performed which showed normal airway anatomy without lesion. The standard scope was then withdrawn and the endobronchial ultrasound was used to identify and characterize the peritracheal, hilar and bronchial lymph nodes. Inspection showed no hilar adenopathy but pretracheal adenopathy was identified as on the CT chest and PET CT. Using real-time ultrasound guidance Wang needle  biopsies were take from Station 4R nodes and were sent for cytology. The patient tolerated the procedure well without apparent complications. There was no significant blood loss. The bronchoscope was withdrawn. Anesthesia was reversed and the patient was taken to the PACU for recovery.   Samples: 1. Wang needle biopsies from 4R node   Plans:  The patient will be discharged from the PACU to home when recovered from anesthesia. We will review the cytology, pathology and microbiology results with the patient when they become available. Outpatient followup will be with Dr. Melvyn Novas and Oncology.    Roselie Awkward, MD [facility] Pager: [phone] Cell: [phone] After 3pm or if no response, call [phone]

## 2014-09-10 NOTE — H&P (Signed)
  [facility]  HPI: Ms. Beahm is a smoker who was found to have mediastinal lymphadenopathy on a CT chest this year.  My partner saw her and recommended that she have an EBUS.    Past Medical History  Diagnosis Date  . Hyperlipidemia   . DVT (deep venous thrombosis)     X 3 (LLE, last "right foot" 2015  . PE (pulmonary embolism)     1984 post-hysterectomy  . DM (diabetes mellitus)     Type II  . Heart murmur   . COPD (chronic obstructive pulmonary disease)   . Shortness of breath dyspnea   . History of pneumonia   . History of bronchitis   . Depression   . Anxiety   . Stress incontinence   . GERD (gastroesophageal reflux disease)   . Headache     migraines - pt. states she take Metoprolol for headaches  . Arthritis   . Anemia   . Nerve damage     hands  . Joint swelling     right thumb  . Peripheral vascular disease     varicose veins LLE  . Kidney cysts     right renal cyst     Family History  Problem Relation Age of Onset  . Emphysema Father     smoked  . Emphysema Daughter     smoked  . Heart disease Father   . Clotting disorder Sister   . Clotting disorder Daughter   . Clotting disorder Son   . Clotting disorder Mother   . Lung cancer Father     smoked  . Lung cancer Sister     smoked  . Breast cancer Sister   . Liver cancer Brother   . Stomach cancer Brother      History   Social History  . Marital Status: Married    Spouse Name: N/A  . Number of Children: N/A  . Years of Education: N/A   Occupational History  . Retired    Social History Main Topics  . Smoking status: Current Every Day Smoker -- 0.50 packs/day for 56 years    Types: Cigarettes  . Smokeless tobacco: Never Used  . Alcohol Use: No  . Drug Use: No  . Sexual Activity: Not on file   Other Topics Concern  . Not on file   Social History Narrative     Allergies  Allergen Reactions  . Asa [Aspirin] Nausea Only  . Ciprofloxacin     Blisters in mouth   . Prednisone    Severe HA     '@encmedstart'$ @  Filed Vitals:   09/10/14 0754  BP: 161/48  Pulse: 62  Temp: 98.2 F (36.8 C)  TempSrc: Oral  Resp: 20  Height: '5\' 3"'$  (1.6 m)  Weight: 136 lb (61.689 kg)  SpO2: 98%   Gen: well appearing HENT: OP clear, neck supple PULM: CTA B, normal percussion CV: RRR, no mgr, trace edema GI: BS+, soft, nontender Derm: no cyanosis or rash Psyche: normal mood and affect   CT Chest images reviewed (PET CT) > large R paratracheal adenopathy, FDG avid, no lung mass or other lesions  Labs from this week reviewed, INR 0.99 and PLT OK  Impression/plan: Mediastinal lymphadenopathy in a smoker, worrisome for malignancy.  Plan bronchoscopy with EBUS guided needle aspiration today  Roselie Awkward, MD [facility] Pager: [phone] Cell: [phone] After 3pm or if no response, call [phone]

## 2014-09-10 NOTE — Anesthesia Preprocedure Evaluation (Addendum)
Anesthesia Evaluation  Patient identified by MRN, date of birth, ID band Patient awake    Reviewed: Allergy & Precautions, NPO status , Patient's Chart, lab work & pertinent test results, reviewed documented beta blocker date and time   Airway Mallampati: I       Dental  (+) Upper Dentures, Partial Lower   Pulmonary COPDCurrent Smoker,    + wheezing      Cardiovascular hypertension, Pt. on medications + Peripheral Vascular Disease + Valvular Problems/Murmurs Rhythm:Regular Rate:Normal     Neuro/Psych  Headaches, PSYCHIATRIC DISORDERS Anxiety Depression    GI/Hepatic GERD-  Medicated,  Endo/Other  diabetes, Type 2  Renal/GU      Musculoskeletal  (+) Arthritis -,   Abdominal   Peds  Hematology  (+) anemia ,   Anesthesia Other Findings   Reproductive/Obstetrics                           EKG: normal sinus rhythm, RBBB.  Anesthesia Physical Anesthesia Plan  ASA: III  Anesthesia Plan: General   Post-op Pain Management:    Induction: Intravenous  Airway Management Planned: Oral ETT  Additional Equipment:   Intra-op Plan:   Post-operative Plan: Extubation in OR  Informed Consent: I have reviewed the patients History and Physical, chart, labs and discussed the procedure including the risks, benefits and alternatives for the proposed anesthesia with the patient or authorized representative who has indicated his/her understanding and acceptance.   Dental advisory given  Plan Discussed with: CRNA  Anesthesia Plan Comments: (Anesthetic plan discussed in detail. Associated risk discussed including but not limited to life threatening cardiovascular, pulmonary events and dental damage. The postoperative pain management and antiemetic plan discussed with patient. All questions answered in detail. Patient is in agreement.   )       Anesthesia Quick Evaluation

## 2014-09-10 NOTE — Anesthesia Postprocedure Evaluation (Signed)
  Anesthesia Post-op Note  Patient: Yvonne Mason  Procedure(s) Performed: Procedure(s): VIDEO BRONCHOSCOPY WITH ENDOBRONCHIAL ULTRASOUND (N/A)  Patient Location: PACU  Anesthesia Type:General  Level of Consciousness: awake and alert   Airway and Oxygen Therapy: Patient Spontanous Breathing  Post-op Pain: minimal  Post-op Assessment: Post-op Vital signs reviewed and Patient's Cardiovascular Status Stable              Post-op Vital Signs: Reviewed and stable  Last Vitals:  Filed Vitals:   09/10/14 1154  BP:   Pulse:   Temp: 36.2 C  Resp:     Complications: No apparent anesthesia complications

## 2014-09-10 NOTE — Transfer of Care (Signed)
Immediate Anesthesia Transfer of Care Note  Patient: Yvonne Mason  Procedure(s) Performed: Procedure(s): VIDEO BRONCHOSCOPY WITH ENDOBRONCHIAL ULTRASOUND (N/A)  Patient Location: PACU  Anesthesia Type:General  Level of Consciousness: awake, alert  and oriented  Airway & Oxygen Therapy: Patient Spontanous Breathing and Patient connected to nasal cannula oxygen  Post-op Assessment: Report given to RN and Post -op Vital signs reviewed and stable  Post vital signs: Reviewed and stable  Last Vitals:  Filed Vitals:   09/10/14 0754  BP: 161/48  Pulse: 62  Temp: 36.8 C  Resp: 20    Complications: No apparent anesthesia complications

## 2014-09-10 NOTE — Telephone Encounter (Signed)
EBUS performed by Dr Lake Bells this am was pos for lung CA  Family was notified by Dr Lake Bells and referral to oncologist in [place] will be made per family's request  I hold hold in my basket to ensure that this is completed

## 2014-09-10 NOTE — Anesthesia Procedure Notes (Signed)
Procedure Name: Intubation Date/Time: 09/10/2014 10:23 AM Performed by: Clearnce Sorrel Pre-anesthesia Checklist: Patient identified, Timeout performed, Emergency Drugs available, Suction available and Patient being monitored Patient Re-evaluated:Patient Re-evaluated prior to inductionOxygen Delivery Method: Circle system utilized Preoxygenation: Pre-oxygenation with 100% oxygen Intubation Type: IV induction Ventilation: Mask ventilation without difficulty and Oral airway inserted - appropriate to patient size Laryngoscope Size: Mac and 3 Grade View: Grade I Tube type: Oral Tube size: 8.0 mm Number of attempts: 1 Airway Equipment and Method: Stylet Placement Confirmation: ETT inserted through vocal cords under direct vision,  breath sounds checked- equal and bilateral and positive ETCO2 Secured at: 22 cm Tube secured with: Tape Dental Injury: Teeth and Oropharynx as per pre-operative assessment

## 2014-09-13 ENCOUNTER — Encounter (HOSPITAL_COMMUNITY): Payer: Self-pay | Admitting: Pulmonary Disease

## 2014-09-14 NOTE — Telephone Encounter (Signed)
Thanks

## 2014-09-14 NOTE — Progress Notes (Signed)
Quick Note:  Pt has appt with Dr Marin Olp in [place] on 09/16/14  ______

## 2014-09-14 NOTE — Telephone Encounter (Signed)
FYI for Dr Lake Bells- pt seeing Dr Marin Olp on 09/16/14

## 2014-09-16 ENCOUNTER — Encounter: Payer: Self-pay | Admitting: Hematology & Oncology

## 2014-09-16 ENCOUNTER — Other Ambulatory Visit (HOSPITAL_BASED_OUTPATIENT_CLINIC_OR_DEPARTMENT_OTHER): Payer: Medicare Other

## 2014-09-16 ENCOUNTER — Ambulatory Visit (HOSPITAL_BASED_OUTPATIENT_CLINIC_OR_DEPARTMENT_OTHER): Payer: Medicare Other | Admitting: Hematology & Oncology

## 2014-09-16 ENCOUNTER — Ambulatory Visit: Payer: Medicare Other

## 2014-09-16 VITALS — BP 117/62 | HR 115 | Temp 97.9°F | Resp 20

## 2014-09-16 DIAGNOSIS — C3491 Malignant neoplasm of unspecified part of right bronchus or lung: Secondary | ICD-10-CM | POA: Diagnosis present

## 2014-09-16 LAB — CBC WITH DIFFERENTIAL (CANCER CENTER ONLY)
BASO#: 0 10*3/uL (ref 0.0–0.2)
BASO%: 0.1 % (ref 0.0–2.0)
EOS ABS: 0.1 10*3/uL (ref 0.0–0.5)
EOS%: 2 % (ref 0.0–7.0)
HEMATOCRIT: 34.2 % — AB (ref 34.8–46.6)
HGB: 11.2 g/dL — ABNORMAL LOW (ref 11.6–15.9)
LYMPH#: 2.1 10*3/uL (ref 0.9–3.3)
LYMPH%: 30.4 % (ref 14.0–48.0)
MCH: 30.4 pg (ref 26.0–34.0)
MCHC: 32.7 g/dL (ref 32.0–36.0)
MCV: 93 fL (ref 81–101)
MONO#: 0.7 10*3/uL (ref 0.1–0.9)
MONO%: 9.8 % (ref 0.0–13.0)
NEUT#: 4.1 10*3/uL (ref 1.5–6.5)
NEUT%: 57.7 % (ref 39.6–80.0)
Platelets: 303 10*3/uL (ref 145–400)
RBC: 3.68 10*6/uL — AB (ref 3.70–5.32)
RDW: 15.3 % (ref 11.1–15.7)
WBC: 7 10*3/uL (ref 3.9–10.0)

## 2014-09-16 NOTE — Progress Notes (Signed)
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 OTHER    New Patient  : I have lung cancer.  HPI: Yvonne Mason is a very nice 69 year old white female. She is from [place].  She was having some chest discomfort. She was not having any hemoptysis. There is no cough. She is having no shortness of breath. Patient had chest x-ray which appear to be abnormal. She then underwent a CT scan of the chest. This showed a 3.6 x 2.3 cm soft tissue mass within the right peritracheal region involving the superior mediastinum. Also noted was a right carinal lymph node measuring 2.4 cm. There is some slight mass effect on the trachea. No obvious pulmonary nodules were noted. There was no pleural effusion. There was no hepatic lesions.  She then underwent a endobronchial ultrasound. This is done on August 5. This is done by Dr. Lake Bells. The pathology report ([id number]) showed squamous cell carcinoma.  I spoke with the pathologist. I'm having the tumor sent off for EGFR, PD-1, and PD-L1 analysis.  She had a PET scan. The PET scan showed hypermetabolic adenopathy in the mediastinum. Everything else looked okay.  She was kindly referred to the [facility] for consideration of therapy.  I would have to probably stage her at stage IIIB given the results of her x-ray studies. I suppose that she also may have stage IIIa disease.  She has a little bit of blood-tinged sputum. This happened after her bronchoscopy.  Her appetite has been okay. She has had some dysphagia but this is chronic.  She's had no change in bowel or bladder habits. She's had no weight loss. She's had no fever. She's had no rashes.  She has a past history of tobacco use. She probably has 100 pack year history of tobacco use. She still smokes a little bit.  There is no obvious asbestos exposure. She worked in an Estate manager/land agent.  Her other is an extensive history of cancer in the family.  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 She is on Coumadin.  Overall, I would say that her performance status is ECOG 1.   Past Medical History  Diagnosis Date  . Hyperlipidemia   . DVT (deep venous thrombosis)     X 3 (LLE, last "right foot" 2015  . PE (pulmonary embolism)     1984 post-hysterectomy  . DM (diabetes mellitus)     Type II  . Heart murmur   . COPD (chronic obstructive pulmonary disease)   . Shortness of breath dyspnea   . History of pneumonia   . History of bronchitis   . Depression   . Anxiety   . Stress incontinence   . GERD (gastroesophageal reflux disease)   . Headache     migraines - pt. states she take Metoprolol for headaches  . Arthritis   . Anemia   . Nerve damage     hands  . Joint swelling     right thumb  . Peripheral vascular disease     varicose veins LLE  . Kidney cysts     right renal cyst  :  Past Surgical History  Procedure Laterality Date  . Vesicovaginal fistula closure w/ tah  1994  . Tubal ligation    . Abdominal hysterectomy    . Appendectomy    . Colonoscopy    . Esophagogastroduodenoscopy    .  Carpal tunnel release Bilateral   . Video bronchoscopy with endobronchial ultrasound N/A 09/10/2014    Procedure: VIDEO BRONCHOSCOPY WITH ENDOBRONCHIAL ULTRASOUND;  Surgeon: Juanito Doom, MD;  Location: [facility] OR;  Service: Thoracic;  Laterality: N/A;  :   Current outpatient prescriptions:  .  acetaminophen (TYLENOL) 650 MG CR tablet, Take 650 mg by mouth every 8 (eight) hours as needed for pain., Disp: , Rfl:  .  albuterol (PROAIR HFA) 108 (90 BASE) MCG/ACT inhaler, Inhale 2 puffs into the lungs every 6 (six) hours as needed for wheezing or shortness of breath., Disp: , Rfl:  .  buPROPion (WELLBUTRIN XL) 150 MG 24 hr tablet, Take 150 mg by mouth every morning., Disp: , Rfl: 7 .  famotidine (PEPCID) 20 MG tablet, One at bedtime (Patient taking  differently: Take 20 mg by mouth at bedtime. One at bedtime), Disp: 30 tablet, Rfl: 11 .  fluticasone (FLONASE) 50 MCG/ACT nasal spray, Place 1 spray into both nostrils daily., Disp: , Rfl:  .  glipiZIDE (GLUCOTROL) 10 MG tablet, Take 10 mg by mouth daily before breakfast., Disp: , Rfl:  .  metoprolol (LOPRESSOR) 50 MG tablet, Take 1 tablet (50 mg total) by mouth 2 (two) times daily. (Patient taking differently: Take 25 mg by mouth 2 (two) times daily. ), Disp: 60 tablet, Rfl: 11 .  omeprazole (PRILOSEC) 40 MG capsule, , Disp: , Rfl:  .  ondansetron (ZOFRAN) 8 MG tablet, Take 8 mg by mouth every 8 (eight) hours as needed., Disp: , Rfl: 0 .  pantoprazole (PROTONIX) 40 MG tablet, Take 1 tablet (40 mg total) by mouth daily. Take 30-60 min before first meal of the day, Disp: 30 tablet, Rfl: 2 .  pioglitazone (ACTOS) 45 MG tablet, Take 45 mg by mouth daily., Disp: , Rfl:  .  Tiotropium Bromide-Olodaterol (STIOLTO RESPIMAT) 2.5-2.5 MCG/ACT AERS, Inhale 2 puffs into the lungs daily., Disp: 1 Inhaler, Rfl: 0 .  traMADol (ULTRAM) 50 MG tablet, 1-2 every 4 hours as needed for cough or pain, Disp: 40 tablet, Rfl: 0 .  warfarin (COUMADIN) 5 MG tablet, Take 2.5-5 mg by mouth daily. Tues and Thurs 2.5 mg. 5 mg all other days, Disp: , Rfl: :  :  Allergies  Allergen Reactions  . Asa [Aspirin] Nausea Only  . Ciprofloxacin     Blisters in mouth   . Prednisone     Severe HA  :  Family History  Problem Relation Age of Onset  . Emphysema Father     smoked  . Emphysema Daughter     smoked  . Heart disease Father   . Clotting disorder Sister   . Clotting disorder Daughter   . Clotting disorder Son   . Clotting disorder Mother   . Lung cancer Father     smoked  . Lung cancer Sister     smoked  . Breast cancer Sister   . Liver cancer Brother   . Stomach cancer Brother   :  Social History   Social History  . Marital Status: Married    Spouse Name: N/A  . Number of Children: N/A  . Years of  Education: N/A   Occupational History  . Retired    Social History Main Topics  . Smoking status: Current Every Day Smoker -- 0.50 packs/day for 56 years    Types: Cigarettes  . Smokeless tobacco: Never Used  . Alcohol Use: No  . Drug Use: No  . Sexual Activity: Not on file  Other Topics Concern  . Not on file   Social History Narrative  :  Pertinent items are noted in HPI.  Exam: @IPVITALS @  fairly well-developed and well-nourished white female in no obvious distress. Vital signs show a temperature of 97.9. Pulse 100. Blood pressure 117/62. Weight is 136 pounds. Head and neck exam shows no ocular or oral lesions. She has no palpable cervical or supraclavicular lymph nodes. Lungs are clear. She has no rales, wheezes or rhonchi. Cardiac exam regular rate and rhythm with no murmurs, rubs or bruits. Abdomen is soft. She has good bowel sounds. There is no fluid wave. There is no palpable liver or spleen tip. Back exam shows no tenderness over the spine, ribs or hips. Extremities shows no clubbing, cyanosis or edema. Skin exam shows no rashes, ecchymoses or petechia. Neurological exam shows no focal neurological deficits.    Recent Labs  09/16/14 1511  WBC 7.0  HGB 11.2*  HCT 34.2*  PLT 303   No results for input(s): NA, K, CL, CO2, GLUCOSE, BUN, CREATININE, CALCIUM in the last 72 hours.  Blood smear review:  None  Pathology: See above     Assessment and Plan:  Yvonne Mason is a very charming 69 year old white female. She has stage IIIB or possibly stage IIIa squamous cell carcinoma the right lung. I think that no matter what she has, she is not operable.  I talked to her and her daughter. I spent about an hour with them. I reviewed the x-rays and the path report.  I think that Ms. Kassing could tolerate combined modality therapy. I think that weekly low-dose chemotherapy along with curative radiation would not be a bad idea. Again, I think she could tolerate this.  I told Ms.  Kinnick and her daughter that we can certainly have her treatments done in [place]. They live down there. It would be a whole lot more easier for them to be treated down there. I will see about making arrangements for her to be seen by radiation oncology and medical oncology.  She probably will need to have an MRI of the brain. I think the chance of a brain metastasis would be less than 5%.  I told Ms. Buskirk that with radiation and chemotherapy, she should have a response rate of over 70%. I think the chance of curing this with nonsurgical therapy would be probably 20%.  She does have a little bit of anemia. I think this is probably anemia of chronic disease or anemia secondary to her diabetes.  As nice as Mrs. Giacobbe is, I don't think we have to get her back to our office. It just is much more convenient for them to be seen down in [place].  I have complete confidence in the oncologist down in [place] at the [facility] and I told Ms. Longshore and her daughter that she would be an excellent hands being treated down there.Marland Kitchen

## 2014-09-17 LAB — COMPREHENSIVE METABOLIC PANEL
ALBUMIN: 3.9 g/dL (ref 3.6–5.1)
ALK PHOS: 58 U/L (ref 33–130)
ALT: 13 U/L (ref 6–29)
AST: 15 U/L (ref 10–35)
BUN: 19 mg/dL (ref 7–25)
CO2: 23 mmol/L (ref 20–31)
Calcium: 9.6 mg/dL (ref 8.6–10.4)
Chloride: 104 mmol/L (ref 98–110)
Creatinine, Ser: 0.82 mg/dL (ref 0.60–0.93)
GLUCOSE: 135 mg/dL — AB (ref 65–99)
Potassium: 4 mmol/L (ref 3.5–5.3)
SODIUM: 138 mmol/L (ref 135–146)
TOTAL PROTEIN: 7.2 g/dL (ref 6.1–8.1)
Total Bilirubin: 0.2 mg/dL (ref 0.2–1.2)

## 2014-09-17 LAB — PREALBUMIN: Prealbumin: 30 mg/dL (ref 17–34)

## 2014-09-21 ENCOUNTER — Encounter (HOSPITAL_COMMUNITY): Payer: Self-pay

## 2014-10-18 ENCOUNTER — Telehealth: Payer: Self-pay | Admitting: Hematology & Oncology

## 2014-10-18 NOTE — Telephone Encounter (Signed)
[facility] requested records, faxed to:  F: [phone] P: [phone]  [place], [place]  [address] Yvonne Mason    COPY SCANNED

## 2014-11-14 DIAGNOSIS — R5081 Fever presenting with conditions classified elsewhere: Secondary | ICD-10-CM | POA: Diagnosis not present

## 2014-11-14 DIAGNOSIS — M545 Low back pain: Secondary | ICD-10-CM

## 2014-11-14 DIAGNOSIS — D6481 Anemia due to antineoplastic chemotherapy: Secondary | ICD-10-CM | POA: Diagnosis not present

## 2014-11-14 DIAGNOSIS — D709 Neutropenia, unspecified: Secondary | ICD-10-CM | POA: Diagnosis not present

## 2014-11-14 DIAGNOSIS — E860 Dehydration: Secondary | ICD-10-CM

## 2014-11-14 DIAGNOSIS — Z7901 Long term (current) use of anticoagulants: Secondary | ICD-10-CM

## 2014-11-14 DIAGNOSIS — C3411 Malignant neoplasm of upper lobe, right bronchus or lung: Secondary | ICD-10-CM

## 2014-11-14 DIAGNOSIS — D6852 Prothrombin gene mutation: Secondary | ICD-10-CM

## 2014-11-16 DIAGNOSIS — R630 Anorexia: Secondary | ICD-10-CM

## 2014-11-16 DIAGNOSIS — K208 Other esophagitis: Secondary | ICD-10-CM | POA: Diagnosis not present

## 2014-11-16 DIAGNOSIS — D649 Anemia, unspecified: Secondary | ICD-10-CM

## 2014-11-16 DIAGNOSIS — C3411 Malignant neoplasm of upper lobe, right bronchus or lung: Secondary | ICD-10-CM

## 2014-11-16 DIAGNOSIS — M4850XA Collapsed vertebra, not elsewhere classified, site unspecified, initial encounter for fracture: Secondary | ICD-10-CM

## 2014-11-19 DIAGNOSIS — C3411 Malignant neoplasm of upper lobe, right bronchus or lung: Secondary | ICD-10-CM

## 2014-11-19 DIAGNOSIS — D649 Anemia, unspecified: Secondary | ICD-10-CM | POA: Diagnosis not present

## 2014-11-19 DIAGNOSIS — D72819 Decreased white blood cell count, unspecified: Secondary | ICD-10-CM

## 2014-11-23 DIAGNOSIS — K5900 Constipation, unspecified: Secondary | ICD-10-CM

## 2014-11-23 DIAGNOSIS — K209 Esophagitis, unspecified: Secondary | ICD-10-CM | POA: Diagnosis not present

## 2014-11-23 DIAGNOSIS — E876 Hypokalemia: Secondary | ICD-10-CM

## 2014-11-23 DIAGNOSIS — Z86718 Personal history of other venous thrombosis and embolism: Secondary | ICD-10-CM

## 2014-11-23 DIAGNOSIS — Z7901 Long term (current) use of anticoagulants: Secondary | ICD-10-CM

## 2014-11-23 DIAGNOSIS — R112 Nausea with vomiting, unspecified: Secondary | ICD-10-CM

## 2014-11-23 DIAGNOSIS — Z86711 Personal history of pulmonary embolism: Secondary | ICD-10-CM

## 2014-11-23 DIAGNOSIS — C3490 Malignant neoplasm of unspecified part of unspecified bronchus or lung: Secondary | ICD-10-CM | POA: Diagnosis not present

## 2014-11-23 DIAGNOSIS — R630 Anorexia: Secondary | ICD-10-CM

## 2014-11-23 DIAGNOSIS — R05 Cough: Secondary | ICD-10-CM | POA: Diagnosis not present

## 2014-11-23 DIAGNOSIS — E860 Dehydration: Secondary | ICD-10-CM | POA: Diagnosis not present

## 2014-12-06 DIAGNOSIS — E860 Dehydration: Secondary | ICD-10-CM | POA: Diagnosis not present

## 2014-12-06 DIAGNOSIS — R1310 Dysphagia, unspecified: Secondary | ICD-10-CM | POA: Diagnosis not present

## 2014-12-06 DIAGNOSIS — C3491 Malignant neoplasm of unspecified part of right bronchus or lung: Secondary | ICD-10-CM | POA: Diagnosis not present

## 2014-12-06 DIAGNOSIS — Z86711 Personal history of pulmonary embolism: Secondary | ICD-10-CM

## 2014-12-06 DIAGNOSIS — Z86718 Personal history of other venous thrombosis and embolism: Secondary | ICD-10-CM

## 2014-12-06 DIAGNOSIS — E871 Hypo-osmolality and hyponatremia: Secondary | ICD-10-CM | POA: Diagnosis not present

## 2014-12-14 DIAGNOSIS — Z86711 Personal history of pulmonary embolism: Secondary | ICD-10-CM

## 2014-12-14 DIAGNOSIS — D6859 Other primary thrombophilia: Secondary | ICD-10-CM

## 2014-12-14 DIAGNOSIS — Z86718 Personal history of other venous thrombosis and embolism: Secondary | ICD-10-CM

## 2014-12-14 DIAGNOSIS — C3490 Malignant neoplasm of unspecified part of unspecified bronchus or lung: Secondary | ICD-10-CM

## 2014-12-14 DIAGNOSIS — I6529 Occlusion and stenosis of unspecified carotid artery: Secondary | ICD-10-CM

## 2014-12-14 DIAGNOSIS — E860 Dehydration: Secondary | ICD-10-CM

## 2014-12-29 ENCOUNTER — Encounter: Payer: Self-pay | Admitting: Vascular Surgery

## 2015-01-04 DIAGNOSIS — C3490 Malignant neoplasm of unspecified part of unspecified bronchus or lung: Secondary | ICD-10-CM

## 2015-01-04 DIAGNOSIS — I2699 Other pulmonary embolism without acute cor pulmonale: Secondary | ICD-10-CM

## 2015-01-04 DIAGNOSIS — K209 Esophagitis, unspecified: Secondary | ICD-10-CM

## 2015-01-04 DIAGNOSIS — D649 Anemia, unspecified: Secondary | ICD-10-CM

## 2015-01-04 DIAGNOSIS — J019 Acute sinusitis, unspecified: Secondary | ICD-10-CM

## 2015-01-04 DIAGNOSIS — I8291 Chronic embolism and thrombosis of unspecified vein: Secondary | ICD-10-CM

## 2015-01-07 ENCOUNTER — Encounter: Payer: Self-pay | Admitting: Vascular Surgery

## 2015-01-11 ENCOUNTER — Encounter: Payer: No Typology Code available for payment source | Admitting: Vascular Surgery

## 2015-02-11 ENCOUNTER — Encounter: Payer: Self-pay | Admitting: Vascular Surgery

## 2015-02-15 ENCOUNTER — Encounter: Payer: No Typology Code available for payment source | Admitting: Vascular Surgery

## 2015-04-06 DEATH — deceased

## 2016-07-16 IMAGING — PT NM PET TUM IMG INITIAL (PI) SKULL BASE T - THIGH
1 of 8 series · 1 of 25 positions shown · non-contrast
Comparison: None.

CLINICAL DATA: Initial treatment strategy for staging of lung
nodule. Mediastinal adenopathy. Left upper lobe nodule. Smoker, with
112 pack-year smoking history..

EXAM:
NUCLEAR MEDICINE PET SKULL BASE TO THIGH
TECHNIQUE: 6.9 mCi F-18 FDG was injected intravenously. Full-ring PET imaging
was performed from the skull base to thigh after the radiotracer. CT
data was obtained and used for attenuation correction and anatomic
localization.
FASTING BLOOD GLUCOSE:  Value: 130 mg/dl

[Series 4: ct sk_thigh 5.0 b31f · axial · 5.0mm · 0.98mm/px · 1 of 200 slices shown]
[im 200/200  brain]
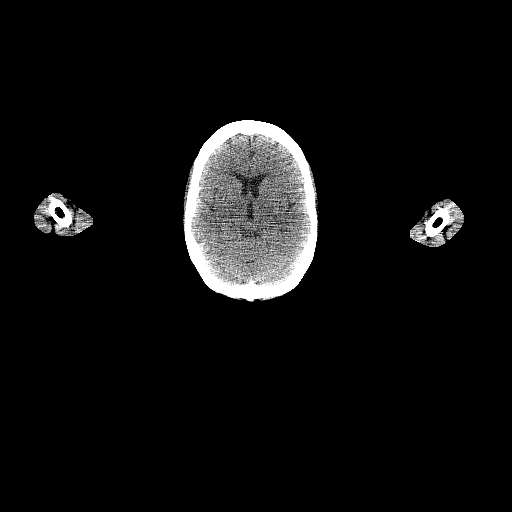

[1 of 25 positions shown; findings below may reference images not displayed]

FINDINGS: NECK

No areas of abnormal hypermetabolism.

CHEST

Hypermetabolism corresponding to high right paratracheal nodal mass.
This measures 3.0 x 3.0 cm and a S.U.V. max of 10.3 on image 53.

Adjacent versus contiguous precarinal adenopathy which measures
cm and a S.U.V. max of 10.3 on image 61.

No pulmonary parenchymal hypermetabolism.

ABDOMEN/PELVIS

No areas of abnormal hypermetabolism.

SKELETON

No abnormal marrow activity. No hypermetabolism to correspond to the
right ninth rib sclerosis on prior CT.

CT IMAGES PERFORMED FOR ATTENUATION CORRECTION

no cervical adenopathy. chest findings deferred to recent diagnostic
ct. mild centrilobular emphysema. Multivessel coronary artery
atherosclerosis. Fluid density right renal lesion which is likely a
cyst. Minimal right adrenal nodularity. Advanced aortic and branch
vessel atherosclerosis. IVC filter below the renal veins. Colonic
stool burden suggests constipation. Hysterectomy. Mild pelvic floor
laxity.
IMPRESSION: 1. Hypermetabolism corresponding to mediastinal adenopathy. No
pulmonary parenchymal correlate identified. Considerations include
lymphoma or otherwise occult small cell lung carcinoma.
2. No extrathoracic metastatic disease. No hypermetabolism to
correspond to the previously described right ninth rib sclerosis.
3.  Atherosclerosis, including within the coronary arteries.
4.  Possible constipation.
